# Patient Record
Sex: Male | Born: 1985 | Race: Black or African American | Hispanic: No | Marital: Single | Smoking: Never smoker
Health system: Southern US, Community
[De-identification: ages and names within clinical notes are randomized; demographics above are authoritative.]

---

## 2016-11-07 ENCOUNTER — Encounter (HOSPITAL_COMMUNITY): Payer: Self-pay | Admitting: Emergency Medicine

## 2016-11-07 ENCOUNTER — Emergency Department (HOSPITAL_COMMUNITY): Payer: No Typology Code available for payment source

## 2016-11-07 ENCOUNTER — Emergency Department (HOSPITAL_COMMUNITY)
Admission: EM | Admit: 2016-11-07 | Discharge: 2016-11-08 | Disposition: A | Discharge status: Home / Self Care | Payer: No Typology Code available for payment source | Attending: Emergency Medicine | Admitting: Emergency Medicine

## 2016-11-07 DIAGNOSIS — S39012A Strain of muscle, fascia and tendon of lower back, initial encounter: Secondary | ICD-10-CM | POA: Diagnosis not present

## 2016-11-07 DIAGNOSIS — Y939 Activity, unspecified: Secondary | ICD-10-CM | POA: Diagnosis not present

## 2016-11-07 DIAGNOSIS — Y92481 Parking lot as the place of occurrence of the external cause: Secondary | ICD-10-CM | POA: Insufficient documentation

## 2016-11-07 DIAGNOSIS — Y999 Unspecified external cause status: Secondary | ICD-10-CM | POA: Insufficient documentation

## 2016-11-07 DIAGNOSIS — S3992XA Unspecified injury of lower back, initial encounter: Secondary | ICD-10-CM | POA: Diagnosis present

## 2016-11-07 MED ORDER — IBUPROFEN 400 MG PO TABS
600.0000 mg | ORAL_TABLET | Freq: Once | ORAL | Status: AC
  Administered 2016-11-07: 600 mg via ORAL
  Filled 2016-11-07: qty 1
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
## 2016-11-07 NOTE — ED Notes (Signed)
Pt reports tingling sensation up his spine
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
## 2016-11-08 MED ORDER — CYCLOBENZAPRINE HCL 10 MG PO TABS: 10.0000 mg | ORAL_TABLET | Freq: Two times a day (BID) | ORAL | 0 refills | Status: AC | PRN

## 2016-11-08 NOTE — Discharge Instructions (Signed)
The muscle relaxant can make you sleepy so do not drive while taking it.  You may take ibuprofen in addition to the muscle relaxant.

## 2016-11-28 ENCOUNTER — Encounter (HOSPITAL_COMMUNITY): Payer: Self-pay | Admitting: Emergency Medicine

## 2016-11-28 ENCOUNTER — Emergency Department (HOSPITAL_COMMUNITY)
Admission: EM | Admit: 2016-11-28 | Discharge: 2016-11-28 | Disposition: A | Discharge status: Home / Self Care | Payer: Self-pay | Attending: Emergency Medicine | Admitting: Emergency Medicine

## 2016-11-28 ENCOUNTER — Emergency Department (HOSPITAL_COMMUNITY): Payer: Self-pay

## 2016-11-28 DIAGNOSIS — R1010 Upper abdominal pain, unspecified: Secondary | ICD-10-CM

## 2016-11-28 DIAGNOSIS — K8020 Calculus of gallbladder without cholecystitis without obstruction: Secondary | ICD-10-CM | POA: Insufficient documentation

## 2016-11-28 DIAGNOSIS — Z79899 Other long term (current) drug therapy: Secondary | ICD-10-CM | POA: Insufficient documentation

## 2016-11-28 LAB — COMPREHENSIVE METABOLIC PANEL
ALK PHOS: 66 U/L (ref 38–126)
ALT: 28 U/L (ref 17–63)
AST: 26 U/L (ref 15–41)
Albumin: 3.9 g/dL (ref 3.5–5.0)
Anion gap: 7 (ref 5–15)
BILIRUBIN TOTAL: 0.5 mg/dL (ref 0.3–1.2)
BUN: 10 mg/dL (ref 6–20)
CHLORIDE: 109 mmol/L (ref 101–111)
CO2: 25 mmol/L (ref 22–32)
CREATININE: 0.64 mg/dL (ref 0.61–1.24)
Calcium: 9 mg/dL (ref 8.9–10.3)
GFR calc Af Amer: >60 mL/min (ref >60)
Glucose, Bld: 113 mg/dL — ABNORMAL HIGH (ref 65–99)
Potassium: 4 mmol/L (ref 3.5–5.1)
Sodium: 141 mmol/L (ref 135–145)
Total Protein: 7.1 g/dL (ref 6.5–8.1)

## 2016-11-28 LAB — CBC
HCT: 41.2 % (ref 39.0–52.0)
Hemoglobin: 12.5 g/dL — ABNORMAL LOW (ref 13.0–17.0)
MCH: 26.8 pg (ref 26.0–34.0)
MCHC: 30.3 g/dL (ref 30.0–36.0)
MCV: 88.2 fL (ref 78.0–100.0)
PLATELETS: 232 10*3/uL (ref 150–400)
RBC: 4.67 MIL/uL (ref 4.22–5.81)
RDW: 13.7 % (ref 11.5–15.5)
WBC: 13.7 10*3/uL — AB (ref 4.0–10.5)

## 2016-11-28 LAB — LIPASE, BLOOD: Lipase: 16 U/L (ref 11–51)

## 2016-11-28 MED ORDER — KETOROLAC TROMETHAMINE 30 MG/ML IJ SOLN
30.0000 mg | Freq: Once | INTRAMUSCULAR | Status: AC
  Administered 2016-11-28: 30 mg via INTRAMUSCULAR
  Filled 2016-11-28: qty 1

## 2016-11-28 MED ORDER — KETOROLAC TROMETHAMINE 30 MG/ML IJ SOLN: 30.0000 mg | Freq: Once | INTRAMUSCULAR | Status: DC

## 2016-11-28 MED ORDER — ONDANSETRON 4 MG PO TBDP
4.0000 mg | ORAL_TABLET | Freq: Once | ORAL | Status: AC | PRN
  Administered 2016-11-28: 4 mg via ORAL

## 2016-11-28 MED ORDER — GI COCKTAIL ~~LOC~~
30.0000 mL | Freq: Once | ORAL | Status: AC
  Administered 2016-11-28: 30 mL via ORAL
  Filled 2016-11-28: qty 30

## 2016-11-28 MED ORDER — ONDANSETRON 4 MG PO TBDP: 4.0000 mg | ORAL_TABLET | Freq: Three times a day (TID) | ORAL | 0 refills | Status: AC | PRN

## 2016-11-28 MED ORDER — ONDANSETRON 4 MG PO TBDP
ORAL_TABLET | ORAL | Status: AC
  Filled 2016-11-28: qty 1

## 2016-11-28 MED ORDER — IBUPROFEN 600 MG PO TABS: 600.0000 mg | ORAL_TABLET | Freq: Four times a day (QID) | ORAL | 0 refills | Status: DC | PRN

## 2016-11-28 NOTE — ED Notes (Signed)
Pt showed this RN two pictures of the vomit he had at home. There was a brown substance in the toilet. The pt states that these two times are the only times that he has vomited, he states that other than that he has "just been gagging".

## 2016-11-28 NOTE — ED Notes (Signed)
Jamie PA at bedside.   
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
## 2016-11-28 NOTE — ED Notes (Signed)
Pt given ginger ale to drink. 

## 2016-11-28 NOTE — ED Provider Notes (Signed)
[facility] Provider Note   CSN: [id number] Arrival date & time: 11/28/16  0848     History   Chief Complaint Chief Complaint  Patient presents with  . Emesis    HPI Joshua Stokes is a 30 y.o. male.  The history is provided by the patient and medical records. No language interpreter was used.   Joshua Stokes is an otherwise healthy  30 y.o. male  who presents to the Emergency Department complaining of sharp upper abdominal pain which has been Progressively getting worse since 3:00 this morning. Patient states that he was awake and about to lie down to go to bed when the pain began. He had 2 episodes of in NBNB emesis. Zofran given prior to evaluation and patient states nausea has now subsided. Yesterday, patient had 4 episodes of nonbloody diarrhea. Patient states on December 9 (8 days ago) he had a similar episode which self resolved in about 24 hours. A few days later he had another episode which again resolved in about 24 hours. He denies fever, chest pain, back pain, dysuria. Denies sick contacts. No new foods and no association with eating.  History reviewed. No pertinent past medical history.  There are no active problems to display for this patient.   History reviewed. No pertinent surgical history.     Home Medications    Prior to Admission medications   Medication Sig Start Date End Date Taking? Authorizing Provider  acetaminophen (TYLENOL) 500 MG tablet Take 1,000 mg by mouth every 6 (six) hours as needed for moderate pain or headache.   Yes Historical Provider, MD  cyclobenzaprine (FLEXERIL) 10 MG tablet Take 1 tablet (10 mg total) by mouth 2 (two) times daily as needed for muscle spasms. Patient not taking: Reported on 11/28/2016 11/08/16   Janne NapoleonHope M Neese, NP  ibuprofen (ADVIL,MOTRIN) 600 MG tablet Take 1 tablet (600 mg total) by mouth every 6 (six) hours as needed. 11/28/16   Chase PicketJaime Pilcher Ward, PA-C  ondansetron (ZOFRAN ODT) 4 MG disintegrating  tablet Take 1 tablet (4 mg total) by mouth every 8 (eight) hours as needed for nausea or vomiting. 11/28/16   Chase PicketJaime Pilcher Ward, PA-C    Family History History reviewed. No pertinent family history.  Social History Social History  Substance Use Topics  . Smoking status: Never Smoker  . Smokeless tobacco: Never Used  . Alcohol use No     Allergies   Patient has no known allergies.   Review of Systems Review of Systems  Constitutional: Negative for chills and fever.  HENT: Negative for congestion.   Eyes: Negative for visual disturbance.  Respiratory: Negative for cough and shortness of breath.   Cardiovascular: Negative.   Gastrointestinal: Positive for abdominal pain, diarrhea, nausea and vomiting. Negative for blood in stool.  Genitourinary: Negative for dysuria.  Musculoskeletal: Negative for back pain and neck pain.  Skin: Negative for rash.  Neurological: Negative for headaches.     Physical Exam Updated Vital Signs BP 117/68   Pulse (!) 57   Temp 98.3 F (36.8 C) (Oral)   Resp 18   SpO2 100%   Physical Exam  Constitutional: He is oriented to person, place, and time. He appears well-developed and well-nourished. No distress.  HENT:  Head: Normocephalic and atraumatic.  Cardiovascular: Normal rate, regular rhythm and normal heart sounds.   No murmur heard. Pulmonary/Chest: Effort normal and breath sounds normal. No respiratory distress.  Abdominal: Soft. He exhibits no distension.  Tenderness to palpation of the epigastrium.  Negative Murphy's. No rebound or guarding. Bowel sounds positive 4.  Musculoskeletal: He exhibits no edema.  Neurological: He is alert and oriented to person, place, and time.  Skin: Skin is warm and dry.  Nursing note and vitals reviewed.    ED Treatments / Results  Labs (all labs ordered are listed, but only abnormal results are displayed) Labs Reviewed  COMPREHENSIVE METABOLIC PANEL - Abnormal; Notable for the following:        Result Value   Glucose, Bld 113 (*)    All other components within normal limits  CBC - Abnormal; Notable for the following:    WBC 13.7 (*)    Hemoglobin 12.5 (*)    All other components within normal limits  LIPASE, BLOOD    EKG  EKG Interpretation None       Radiology Kore[place] Abdomen Limited Ruq  Result Date: 11/28/2016 CLINICAL DATA:  Upper abdominal pain EXAM: US ABDOMEN LIMITED - RIGHT UPPER QUADRANT COMPARISON:  None. FINDINGS: Gallbladder: Multiple stones in the gallbladder, the largest 11 mm. Probable sludge. No wall thickening. Negative sonographic Murphy's. Common bile duct: Diameter: Normal caliber, 4 mm Liver: Increased echotexture throughout the liver compatible with fatty infiltration. Area of focal fatty sparing near the gallbladder fossa. No focal abnormality. No biliary ductal dilatation. IMPRESSION: Cholelithiasis. There is probable sludge also in the gallbladder. No wall thickening or sonographic Murphy's sign. Fatty infiltration of the liver. Electronically Signed   By: Charlett NoseKevin  Dover M.D.   On: 11/28/2016 10:36    Procedures Procedures (including critical care time)  Medications Ordered in ED Medications  ondansetron (ZOFRAN-ODT) 4 MG disintegrating tablet (not administered)  ondansetron (ZOFRAN-ODT) disintegrating tablet 4 mg (4 mg Oral Given 11/28/16 0857)  gi cocktail (Maalox,Lidocaine,Donnatal) (30 mLs Oral Given 11/28/16 0955)  ketorolac (TORADOL) 30 MG/ML injection 30 mg (30 mg Intramuscular Given 11/28/16 1158)     Initial Impression / Assessment and Plan / ED Course  I have reviewed the triage vital signs and the nursing notes.  Pertinent labs & imaging results that were available during my care of the patient were reviewed by me and considered in my medical decision making (see chart for details).  Clinical Course    Joshua Stokes is a 30 y.o. male who presents to ED for upper abdominal pain associated with 2 episodes of emesis today and 4 loose  stools with no blood noted yesterday. Patient has similar episode 2 days last 8 days which self resolved 24 hours. No sick contacts. We'll obtain labs, urine, upper abdominal ultrasound. GI cocktail given and will continue to monitor.  Labs reviewed: Lipase normal, CMP reassuring, CBC with white count 13.7 Ultrasound shows cholelithiasis and probable sludge. No wall thickening or sonographic Murphy's. No biliary duct dilation. Does not appear to have cholecystitis on ultrasound or clinically. Toradol given and patient had improvement in symptoms. Tolerating ginger ale with no episodes of emesis since Zofran administration. Will provide general surgery follow-up. Rx for ibuprofen and Zofran given. Return precautions, home care instructions, and follow-up discussed with patient. All questions answered.   Final Clinical Impressions(s) / ED Diagnoses   Final diagnoses:  Upper abdominal pain  Gallstones    New Prescriptions New Prescriptions   IBUPROFEN (ADVIL,MOTRIN) 600 MG TABLET    Take 1 tablet (600 mg total) by mouth every 6 (six) hours as needed.   ONDANSETRON (ZOFRAN ODT) 4 MG DISINTEGRATING TABLET    Take 1 tablet (4 mg total) by mouth every 8 (eight) hours as needed  for nausea or vomiting.     Chase Picket Ward, PA-C 11/28/16 1231    Canary Brim Tegeler, MD 11/29/16 [phone]

## 2016-11-28 NOTE — Discharge Instructions (Signed)
Zofran as needed for nausea. Ibuprofen as needed for pain.  Please call the surgery clinic listed in the morning to schedule a follow-up appointment. Please seek immediate care if you develop any of the following symptoms: The pain does not go away.  You have a fever.  You keep throwing up (vomiting). You pass bloody or black tarry stools.  There is bright red blood in the stool.  There is rectal pain.  You do not seem to be getting better.  You have any questions or concerns.

## 2017-02-15 IMAGING — US US ABDOMEN LIMITED
1 series · 14 of 25 positions shown · non-contrast
Comparison: None.

CLINICAL DATA: Upper abdominal pain

EXAM:
US ABDOMEN LIMITED - RIGHT UPPER QUADRANT

[Series 1: us abdomen limited · 0.25mm/px · 14 of 29 slices shown]
[im 1/29]
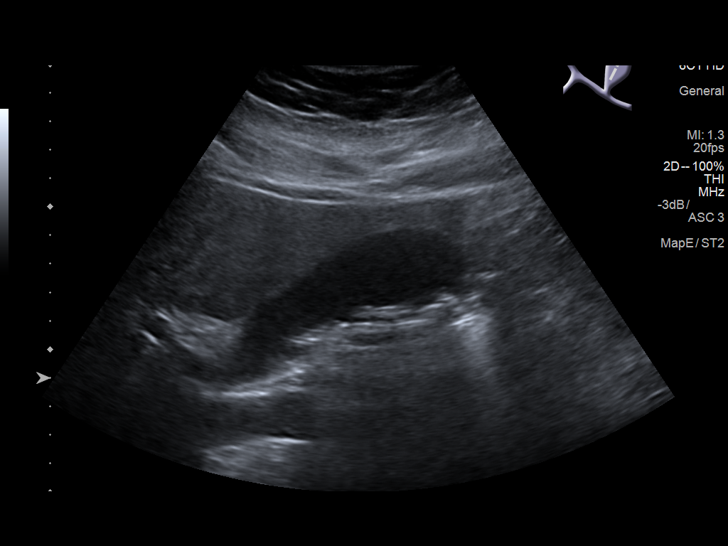
[im 3/29]
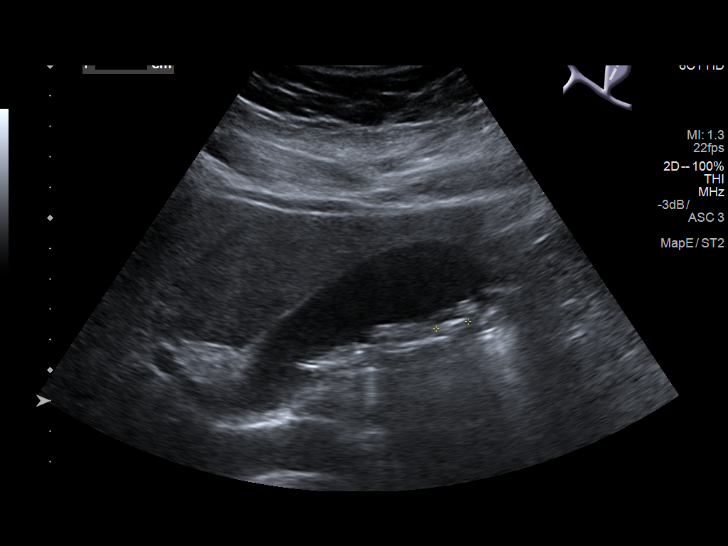
[im 5/29]
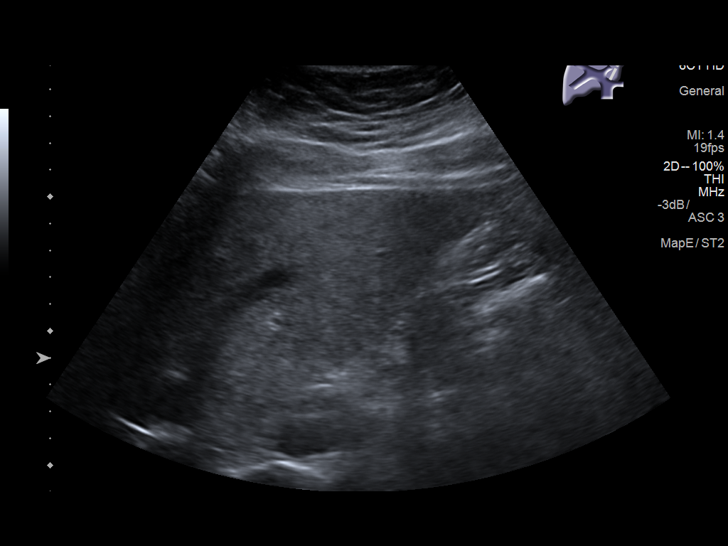
[im 8/29]
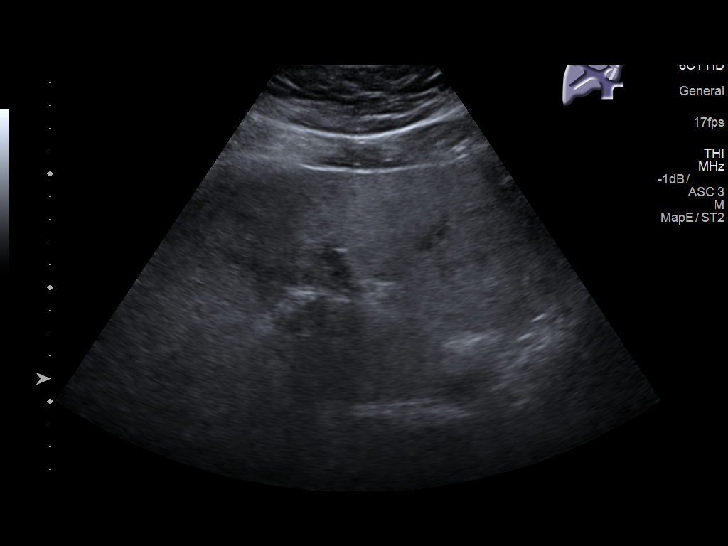
[im 10/29]
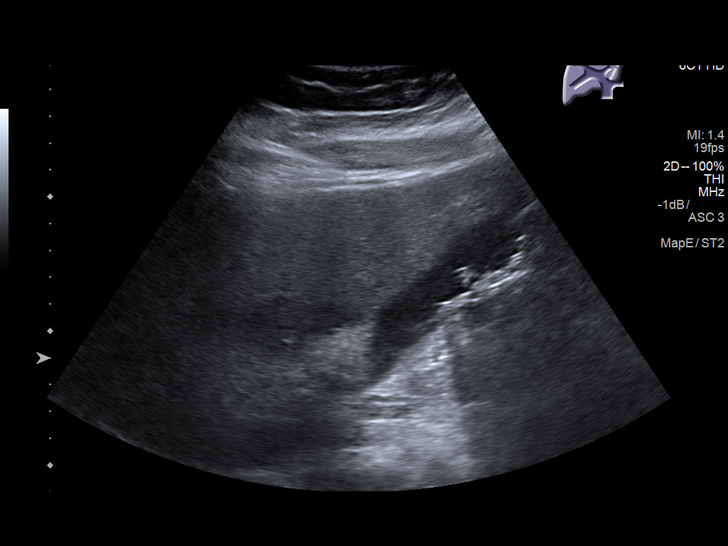
[im 11/29]
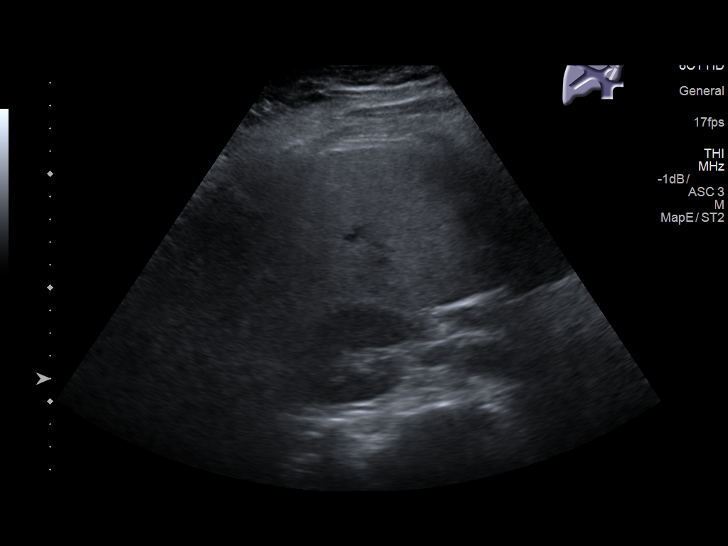
[im 13/29]
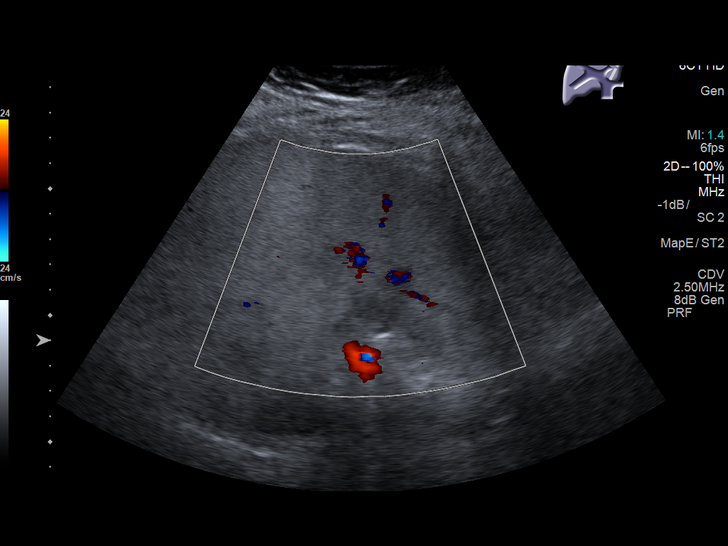
[im 16/29]
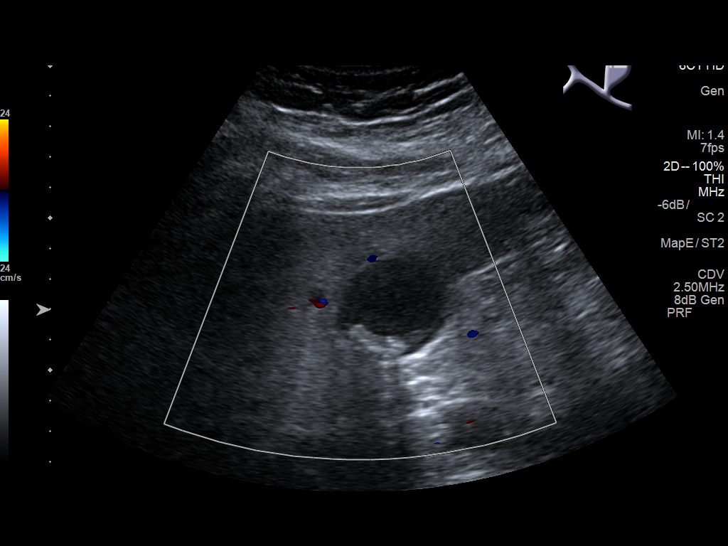
[im 18/29]
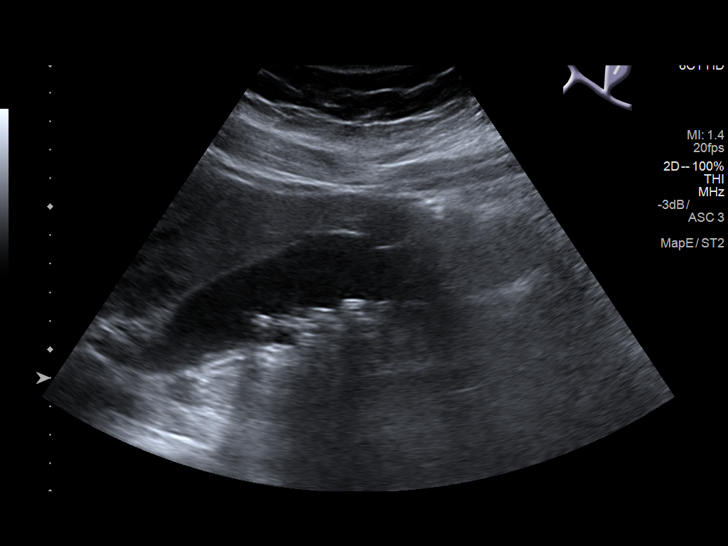
[im 19/29]
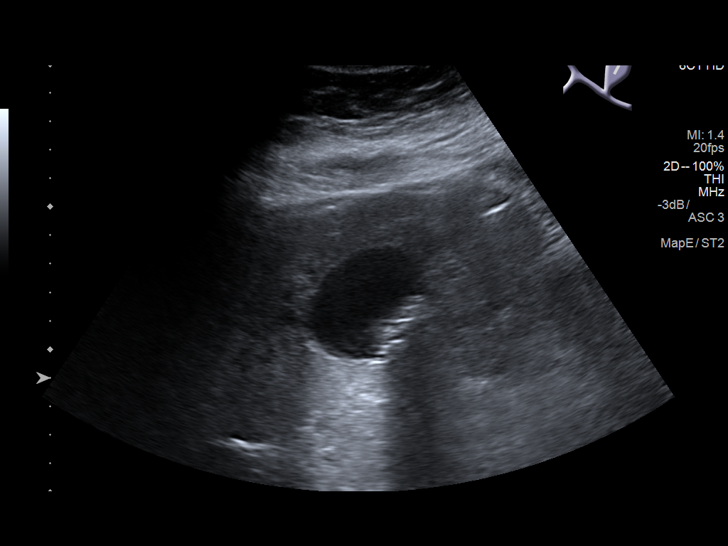
[im 22/29]
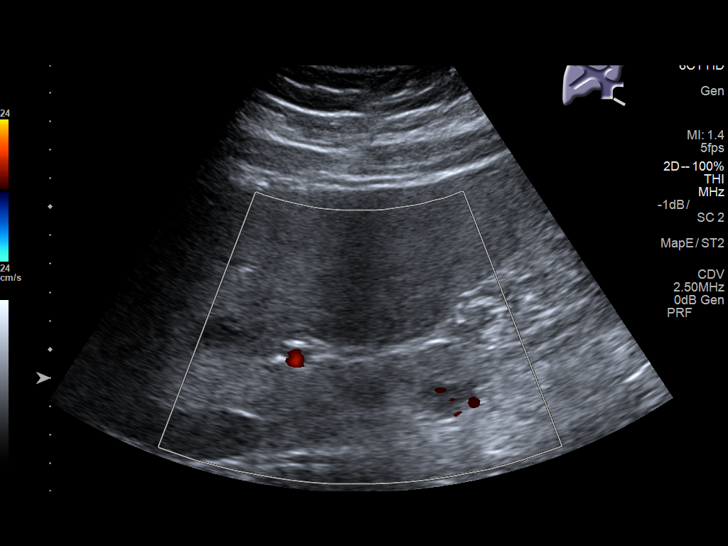
[im 24/29]
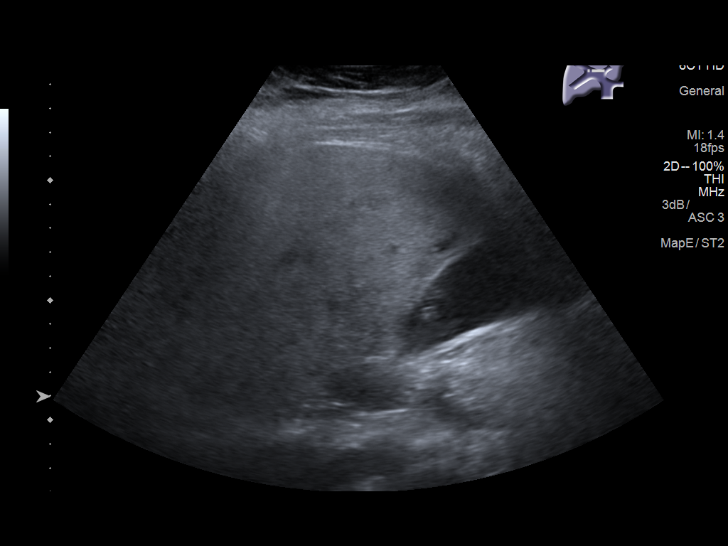
[im 26/29]
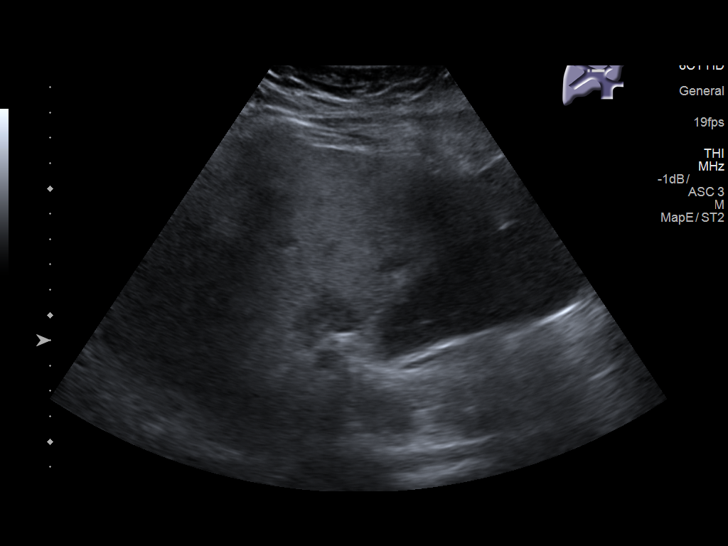
[im 29/29]
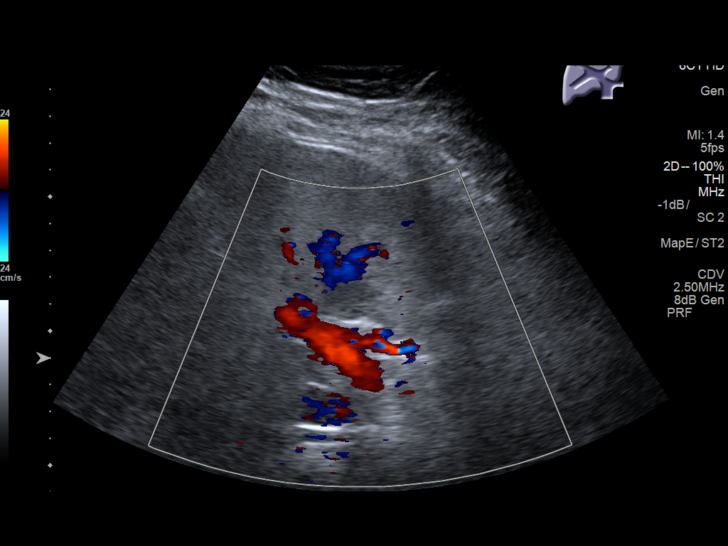

[14 of 25 positions shown; findings below may reference images not displayed]

FINDINGS: Gallbladder:

Multiple stones in the gallbladder, the largest 11 mm. Probable
sludge. No wall thickening. Negative sonographic Shashank.

Common bile duct:

Diameter: Normal caliber, 4 mm

Liver:

Increased echotexture throughout the liver compatible with fatty
infiltration. Area of focal fatty sparing near the gallbladder
fossa. No focal abnormality. No biliary ductal dilatation.
IMPRESSION: Cholelithiasis. There is probable sludge also in the gallbladder. No
wall thickening or sonographic Murphy's sign.

Fatty infiltration of the liver.

## 2017-07-24 ENCOUNTER — Emergency Department (HOSPITAL_COMMUNITY)
Admission: EM | Admit: 2017-07-24 | Discharge: 2017-07-24 | Disposition: A | Discharge status: Home / Self Care | Payer: Self-pay | Attending: Emergency Medicine | Admitting: Emergency Medicine

## 2017-07-24 ENCOUNTER — Encounter (HOSPITAL_COMMUNITY): Payer: Self-pay | Admitting: Emergency Medicine

## 2017-07-24 DIAGNOSIS — W228XXA Striking against or struck by other objects, initial encounter: Secondary | ICD-10-CM | POA: Insufficient documentation

## 2017-07-24 DIAGNOSIS — S0501XA Injury of conjunctiva and corneal abrasion without foreign body, right eye, initial encounter: Secondary | ICD-10-CM

## 2017-07-24 DIAGNOSIS — Y929 Unspecified place or not applicable: Secondary | ICD-10-CM | POA: Insufficient documentation

## 2017-07-24 DIAGNOSIS — Y9389 Activity, other specified: Secondary | ICD-10-CM | POA: Insufficient documentation

## 2017-07-24 DIAGNOSIS — Y999 Unspecified external cause status: Secondary | ICD-10-CM | POA: Insufficient documentation

## 2017-07-24 DIAGNOSIS — Z79899 Other long term (current) drug therapy: Secondary | ICD-10-CM | POA: Insufficient documentation

## 2017-07-24 DIAGNOSIS — Z7983 Long term (current) use of bisphosphonates: Secondary | ICD-10-CM | POA: Insufficient documentation

## 2017-07-24 MED ORDER — ERYTHROMYCIN 5 MG/GM OP OINT: 1.0000 "application " | TOPICAL_OINTMENT | Freq: Four times a day (QID) | OPHTHALMIC | 1 refills | Status: AC

## 2017-07-24 MED ORDER — FLUORESCEIN SODIUM 0.6 MG OP STRP
1.0000 | ORAL_STRIP | Freq: Once | OPHTHALMIC | Status: AC
  Administered 2017-07-24: 1 via OPHTHALMIC
  Filled 2017-07-24: qty 1

## 2017-07-24 MED ORDER — NAPROXEN 500 MG PO TABS: 500.0000 mg | ORAL_TABLET | Freq: Two times a day (BID) | ORAL | 0 refills | Status: AC

## 2017-07-24 MED ORDER — PROPARACAINE HCL 0.5 % OP SOLN
1.0000 [drp] | Freq: Once | OPHTHALMIC | Status: AC
  Administered 2017-07-24: 1 [drp] via OPHTHALMIC
  Filled 2017-07-24: qty 15

## 2017-07-24 NOTE — ED Provider Notes (Signed)
[facility]-EMERGENCY DEPT Provider Note   CSN: [id number] Arrival date & time: 07/24/17  1818  By signing my name below, I, Linna Darner, attest that this documentation has been prepared under the direction and in the presence of Will Azarie Coriz, PA-C. Electronically Signed: Linna Darner, Scribe. 07/24/2017. 9:11 PM.  History   Chief Complaint Chief Complaint  Patient presents with  . Eye Pain   The history is provided by the patient. No language interpreter was used.    HPI Comments: Joshua Stokes is a 31 y.o. male who presents to the Emergency Department for evaluation s/p right eye injury that occurred a few hours ago. He stuck his head out of a window to look outside and inadvertently struck the right eye against a wooden lamppost. He states that his right eye was closed when the injury was sustained. Patient is endorsing persistent right eye pain, redness, and watering since the injury occurred. Patient states that it feels like something is in his right eye. He reports difficulty opening his right eye secondary to pain. He states that the eye pain is causing a frontal headache. There are no alleviating factors noted. Patient does not wear contact lenses or glasses, although he has a prescription for corrective lenses. NKDA. He denies vision changes, nausea, vomiting, or any other associated symptoms.  History reviewed. No pertinent past medical history.  There are no active problems to display for this patient.   History reviewed. No pertinent surgical history.     Home Medications    Prior to Admission medications   Medication Sig Start Date End Date Taking? Authorizing Provider  acetaminophen (TYLENOL) 500 MG tablet Take 1,000 mg by mouth every 6 (six) hours as needed for moderate pain or headache.    [provider]  cyclobenzaprine (FLEXERIL) 10 MG tablet Take 1 tablet (10 mg total) by mouth 2 (two) times daily as needed for muscle spasms. Patient not taking:  Reported on 11/28/2016 11/08/16   Janne Napoleon, NP  erythromycin ophthalmic ointment Place 1 application into the right eye every 6 (six) hours. Place 1/2 inch ribbon of ointment in the affected eye 4 times a day 07/24/17   Everlene Farrier, PA-C  naproxen (NAPROSYN) 500 MG tablet Take 1 tablet (500 mg total) by mouth 2 (two) times daily with a meal. 07/24/17   Everlene Farrier, PA-C  ondansetron (ZOFRAN ODT) 4 MG disintegrating tablet Take 1 tablet (4 mg total) by mouth every 8 (eight) hours as needed for nausea or vomiting. 11/28/16   Ward, Chase Picket, PA-C    Family History No family history on file.  Social History Social History  Substance Use Topics  . Smoking status: Never Smoker  . Smokeless tobacco: Never Used  . Alcohol use No     Allergies   Patient has no known allergies.   Review of Systems Review of Systems  Constitutional: Negative for chills and fever.  Eyes: Positive for pain (R) and redness (R). Negative for visual disturbance.  Gastrointestinal: Negative for nausea and vomiting.  Skin: Negative for rash and wound.  Neurological: Positive for headaches. Negative for dizziness and light-headedness.   Physical Exam Updated Vital Signs BP 136/81 (BP Location: Right Arm)   Pulse 81   Temp 99 F (37.2 C) (Oral)   Resp 18   SpO2 99%   Physical Exam  Constitutional: He appears well-developed and well-nourished. No distress.  Nontoxic appearing.  HENT:  Head: Normocephalic and atraumatic.  Right Ear: External ear normal.  Left  Ear: External ear normal.  Mouth/Throat: Oropharynx is clear and moist.  Eyes: Pupils are equal, round, and reactive to light. EOM are normal. Right eye exhibits no discharge. Left eye exhibits no discharge. Right conjunctiva is injected.  Slit lamp exam:      The right eye shows corneal abrasion and fluorescein uptake.  Bilateral eyes were anesthetized with proparacaine and right eye was stained with fluorescein. Central corneal  abrasion on the right noted. Right and left intraocular pressures are 14 via TonoPen. No Seidel's sign. No evidence of any foreign body. EOMs are intact.  Neck: Neck supple.  Cardiovascular: Normal rate, regular rhythm, normal heart sounds and intact distal pulses.   Pulmonary/Chest: Effort normal and breath sounds normal. No respiratory distress.  Lymphadenopathy:    He has no cervical adenopathy.  Neurological: He is alert. No cranial nerve deficit. Coordination normal.  Skin: Skin is warm and dry. Capillary refill takes less than 2 seconds. No rash noted. He is not diaphoretic. No erythema. No pallor.  Psychiatric: He has a normal mood and affect. His behavior is normal.  Nursing note and vitals reviewed.  ED Treatments / Results  Labs (all labs ordered are listed, but only abnormal results are displayed) Labs Reviewed - No data to display  EKG  EKG Interpretation None       Radiology No results found.  Procedures Procedures (including critical care time)    Visual Acuity  Right Eye Distance: 20/25 Left Eye Distance: 20/20 Bilateral Distance: 20/20  Right Eye Near: R Near: 20/25 Left Eye Near:  L Near: 20/20 Bilateral Near:  20/20  DIAGNOSTIC STUDIES: Oxygen Saturation is 99% on RA, normal by my interpretation.    COORDINATION OF CARE: 9:08 PM Discussed treatment plan with pt at bedside and pt agreed to plan.  Medications Ordered in ED Medications  fluorescein ophthalmic strip 1 strip (not administered)  proparacaine (ALCAINE) 0.5 % ophthalmic solution 1 drop (not administered)     Initial Impression / Assessment and Plan / ED Course  I have reviewed the triage vital signs and the nursing notes.  Pertinent labs & imaging results that were available during my care of the patient were reviewed by me and considered in my medical decision making (see chart for details).    This is a 31 y.o. male who presents to the Emergency Department for evaluation s/p right  eye injury that occurred a few hours ago. He stuck his head out of a window to look outside and inadvertently struck the right eye against a wooden lamppost. He states that his right eye was closed when the injury was sustained. Patient is endorsing persistent right eye pain, redness, and watering since the injury occurred. Patient states that it feels like something is in his right eye. He reports difficulty opening his right eye secondary to pain. He states that the eye pain is causing a frontal headache. There are no alleviating factors noted. Patient does not wear contact lenses or glasses. He denies any changes to his vision or double vision. On exam patient is afebrile nontoxic appearing. No lacerations noted to his eyelid. On fluorescein staining he does have a central corneal abrasion. No evidence of globe rupture or foreign body. Visual acuity is 20/25 bilaterally. Pain is relieved with proparacaine. We'll discharge with erythromycin and naproxen for pain control. Follow-up with ophthalmology. I discussed the expected course and treatment of a corneal abrasion. Return precautions discussed. I advised the patient to follow-up with their primary care provider  this week. I advised the patient to return to the emergency department with new or worsening symptoms or new concerns. The patient verbalized understanding and agreement with plan.      Final Clinical Impressions(s) / ED Diagnoses   Final diagnoses:  Abrasion of right cornea, initial encounter    New Prescriptions New Prescriptions   ERYTHROMYCIN OPHTHALMIC OINTMENT    Place 1 application into the right eye every 6 (six) hours. Place 1/2 inch ribbon of ointment in the affected eye 4 times a day   NAPROXEN (NAPROSYN) 500 MG TABLET    Take 1 tablet (500 mg total) by mouth 2 (two) times daily with a meal.    I personally performed the services described in this documentation, which was scribed in my presence. The recorded information has  been reviewed and is accurate.      Everlene FarrierDansie, Azarie Coriz, PA-C 07/24/17 2131    Little, Ambrose Finlandachel Morgan, MD 07/27/17 [phone]

## 2017-07-24 NOTE — ED Triage Notes (Signed)
Patient ran into a lamp that had extensions that look like tree branches, the extension went into his right eye.  He states that his eye feels like something is in it.  The eye is red, tearing.  He states that the pain is causing a headache now.  The incident happened about 5 hours ago, no blurred vision, no problems seeing at this time.  The eye is irritated and mild swelling to the tissue around eye.

## 2017-07-24 NOTE — ED Notes (Signed)
Visual acuity performed, the Pt could not see beyond 20/25 with right eye.
